# Patient Record
Sex: Female | Born: 1975 | Hispanic: Refuse to answer | Marital: Married | Smoking: Never smoker
Health system: Southern US, Community
[De-identification: ages and names within clinical notes are randomized; demographics above are authoritative.]

---

## 1999-02-19 ENCOUNTER — Other Ambulatory Visit: Admission: RE | Admit: 1999-02-19 | Discharge: 1999-02-19 | Payer: Self-pay | Admitting: Obstetrics and Gynecology

## 2000-06-27 ENCOUNTER — Other Ambulatory Visit: Admission: RE | Admit: 2000-06-27 | Discharge: 2000-06-27 | Payer: Self-pay | Admitting: Obstetrics and Gynecology

## 2001-08-21 ENCOUNTER — Other Ambulatory Visit: Admission: RE | Admit: 2001-08-21 | Discharge: 2001-08-21 | Payer: Self-pay | Admitting: Obstetrics and Gynecology

## 2002-05-23 ENCOUNTER — Other Ambulatory Visit: Admission: RE | Admit: 2002-05-23 | Discharge: 2002-05-23 | Payer: Self-pay | Admitting: Obstetrics and Gynecology

## 2014-02-21 ENCOUNTER — Other Ambulatory Visit (HOSPITAL_COMMUNITY)
Admission: RE | Admit: 2014-02-21 | Discharge: 2014-02-21 | Disposition: A | Discharge status: Home / Self Care | Payer: BC Managed Care – PPO | Source: Ambulatory Visit | Attending: Obstetrics and Gynecology | Admitting: Obstetrics and Gynecology

## 2014-02-21 ENCOUNTER — Other Ambulatory Visit: Payer: Self-pay | Admitting: Obstetrics and Gynecology

## 2014-02-21 DIAGNOSIS — Z1151 Encounter for screening for human papillomavirus (HPV): Secondary | ICD-10-CM | POA: Insufficient documentation

## 2014-02-21 DIAGNOSIS — Z01419 Encounter for gynecological examination (general) (routine) without abnormal findings: Secondary | ICD-10-CM | POA: Insufficient documentation

## 2014-02-24 LAB — CYTOLOGY - PAP

## 2014-06-12 ENCOUNTER — Ambulatory Visit
Admission: RE | Admit: 2014-06-12 | Discharge: 2014-06-12 | Disposition: A | Discharge status: Home / Self Care | Payer: BLUE CROSS/BLUE SHIELD | Source: Ambulatory Visit | Attending: Internal Medicine | Admitting: Internal Medicine

## 2014-06-12 ENCOUNTER — Other Ambulatory Visit: Payer: Self-pay | Admitting: Internal Medicine

## 2014-06-12 DIAGNOSIS — M79671 Pain in right foot: Secondary | ICD-10-CM

## 2018-06-19 ENCOUNTER — Other Ambulatory Visit (HOSPITAL_COMMUNITY)
Admission: RE | Admit: 2018-06-19 | Discharge: 2018-06-19 | Disposition: A | Discharge status: Home / Self Care | Payer: BLUE CROSS/BLUE SHIELD | Source: Ambulatory Visit | Attending: Obstetrics and Gynecology | Admitting: Obstetrics and Gynecology

## 2018-06-19 ENCOUNTER — Other Ambulatory Visit: Payer: Self-pay | Admitting: Obstetrics and Gynecology

## 2018-06-19 DIAGNOSIS — Z124 Encounter for screening for malignant neoplasm of cervix: Secondary | ICD-10-CM | POA: Insufficient documentation

## 2018-06-19 DIAGNOSIS — Z01411 Encounter for gynecological examination (general) (routine) with abnormal findings: Secondary | ICD-10-CM | POA: Insufficient documentation

## 2018-06-22 LAB — CYTOLOGY - PAP
Diagnosis: NEGATIVE
HPV (WINDOPATH): NOT DETECTED

## 2019-07-04 ENCOUNTER — Other Ambulatory Visit: Payer: Self-pay | Admitting: Internal Medicine

## 2019-07-04 ENCOUNTER — Ambulatory Visit
Admission: RE | Admit: 2019-07-04 | Discharge: 2019-07-04 | Disposition: A | Discharge status: Home / Self Care | Payer: No Typology Code available for payment source | Source: Ambulatory Visit | Attending: Internal Medicine | Admitting: Internal Medicine

## 2019-07-04 DIAGNOSIS — R1033 Periumbilical pain: Secondary | ICD-10-CM

## 2020-12-06 IMAGING — CR DG ABDOMEN 2V
2 series · 2 of 2 positions shown · non-contrast
Comparison: None.

CLINICAL DATA: Periumbilical abdominal pain for 4-5 days.

EXAM:
ABDOMEN - 2 VIEW

[t abdomen supine]
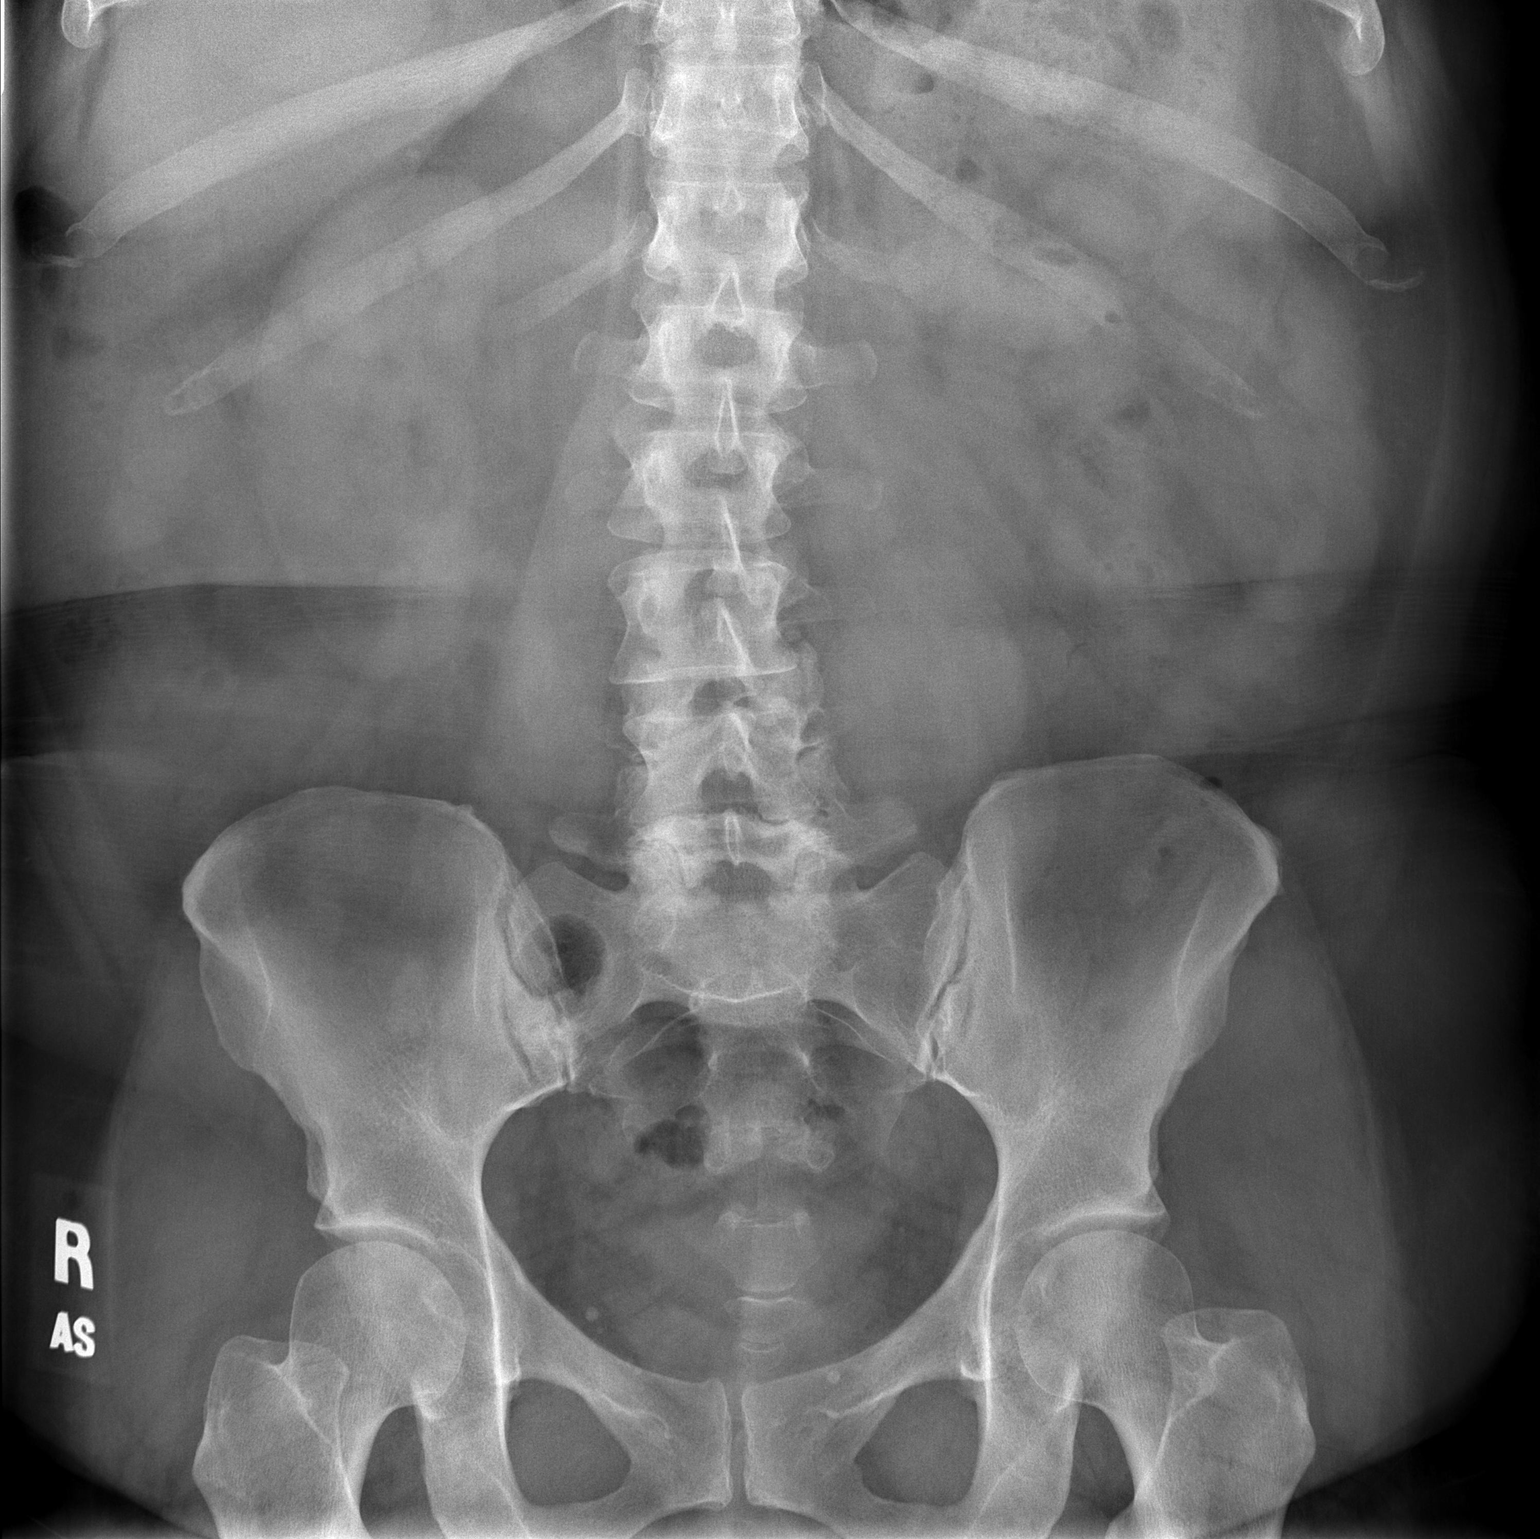

[w abdomen upright]
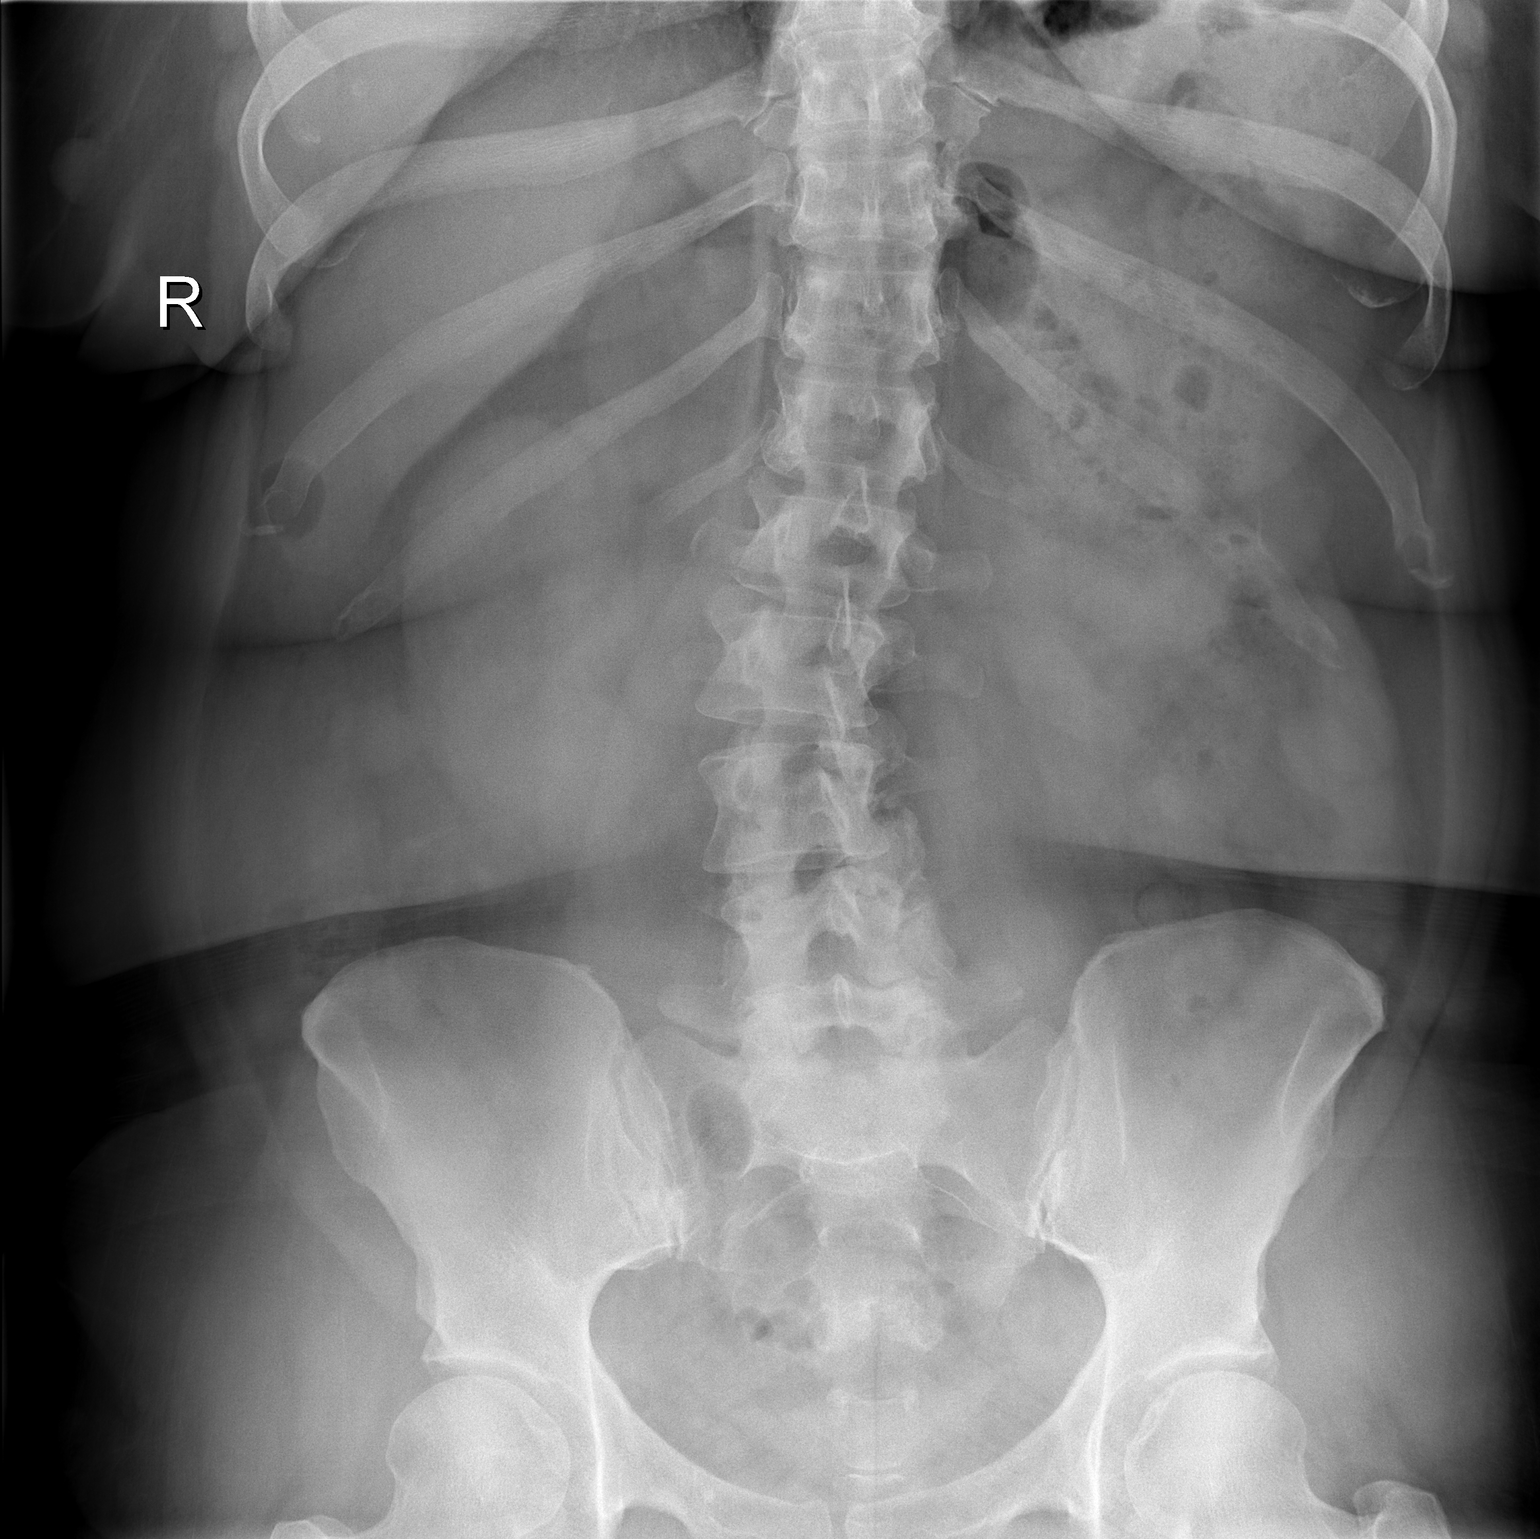

[2 of 2 positions shown; findings below may reference images not displayed]

FINDINGS: The bowel gas pattern is normal. There is no evidence of free air.
No radio-opaque calculi or other significant radiographic
abnormality is seen.
IMPRESSION: Negative exam.

## 2022-05-02 ENCOUNTER — Emergency Department (HOSPITAL_BASED_OUTPATIENT_CLINIC_OR_DEPARTMENT_OTHER): Payer: No Typology Code available for payment source | Admitting: Radiology

## 2022-05-02 ENCOUNTER — Emergency Department (HOSPITAL_BASED_OUTPATIENT_CLINIC_OR_DEPARTMENT_OTHER)
Admission: EM | Admit: 2022-05-02 | Discharge: 2022-05-02 | Disposition: A | Discharge status: Home / Self Care | Payer: No Typology Code available for payment source | Attending: Emergency Medicine | Admitting: Emergency Medicine

## 2022-05-02 ENCOUNTER — Other Ambulatory Visit: Payer: Self-pay

## 2022-05-02 DIAGNOSIS — I10 Essential (primary) hypertension: Secondary | ICD-10-CM | POA: Diagnosis not present

## 2022-05-02 DIAGNOSIS — E119 Type 2 diabetes mellitus without complications: Secondary | ICD-10-CM | POA: Insufficient documentation

## 2022-05-02 DIAGNOSIS — R0789 Other chest pain: Secondary | ICD-10-CM | POA: Diagnosis not present

## 2022-05-02 LAB — CBC
HCT: 41 % (ref 36.0–46.0)
Hemoglobin: 13.7 g/dL (ref 12.0–15.0)
MCH: 30.6 pg (ref 26.0–34.0)
MCHC: 33.4 g/dL (ref 30.0–36.0)
MCV: 91.5 fL (ref 80.0–100.0)
Platelets: 404 10*3/uL — ABNORMAL HIGH (ref 150–400)
RBC: 4.48 MIL/uL (ref 3.87–5.11)
RDW: 13 % (ref 11.5–15.5)
WBC: 11.3 10*3/uL — ABNORMAL HIGH (ref 4.0–10.5)
nRBC: 0 % (ref 0.0–0.2)

## 2022-05-02 LAB — BASIC METABOLIC PANEL
Anion gap: 9 (ref 5–15)
BUN: 13 mg/dL (ref 6–20)
CO2: 27 mmol/L (ref 22–32)
Calcium: 10.2 mg/dL (ref 8.9–10.3)
Chloride: 101 mmol/L (ref 98–111)
Creatinine, Ser: 0.75 mg/dL (ref 0.44–1.00)
GFR, Estimated: >60 mL/min (ref >60)
Glucose, Bld: 116 mg/dL — ABNORMAL HIGH (ref 70–99)
Potassium: 3.7 mmol/L (ref 3.5–5.1)
Sodium: 137 mmol/L (ref 135–145)

## 2022-05-02 LAB — TROPONIN I (HIGH SENSITIVITY)
Troponin I (High Sensitivity): 3 ng/L (ref <18)
Troponin I (High Sensitivity): 3 ng/L (ref <18)

## 2022-05-02 NOTE — ED Provider Notes (Signed)
[facility] Provider Note   CSN: [id number] Arrival date & time: 05/02/22  0131     History  Chief Complaint  Patient presents with   Chest Pain    Tamara Lopez is a 47 y.o. female.  HPI     This is a 47 year old female with a history of diabetes and hypertension who presents with chest pain.  Patient reports onset of chest pain around 8 PM last night.  She states she was sitting in her chair.  It is pressure-like and radiates up into her neck.  No recent illnesses, fevers, shortness of breath.  Pain has been fairly constant.  It is not exertional in nature.  Nothing seems to make it better or worse.  She has never had pain like this before.  Home Medications Prior to Admission medications   Not on File      Allergies    Patient has no allergy information on record.    Review of Systems   Review of Systems  Constitutional:  Negative for fever.  Respiratory:  Negative for shortness of breath.   Cardiovascular:  Positive for chest pain. Negative for leg swelling.    Physical Exam Updated Vital Signs BP (!) 162/104   Pulse 93   Temp 98.7 F (37.1 C) (Oral)   Resp 16   Ht 1.657 m (5' 5.25")   Wt 95.7 kg   SpO2 100%   BMI 34.84 kg/m  Physical Exam Vitals and nursing note reviewed.  Constitutional:      Appearance: She is well-developed. She is obese. She is not ill-appearing.  HENT:     Head: Normocephalic and atraumatic.  Eyes:     Pupils: Pupils are equal, round, and reactive to light.  Cardiovascular:     Rate and Rhythm: Normal rate and regular rhythm.     Heart sounds: Normal heart sounds.  Pulmonary:     Effort: Pulmonary effort is normal. No respiratory distress.     Breath sounds: No wheezing.  Abdominal:     General: Bowel sounds are normal.     Palpations: Abdomen is soft.  Musculoskeletal:     Cervical back: Neck supple.  Skin:    General: Skin is warm and dry.  Neurological:     Mental Status:  She is alert and oriented to person, place, and time.  Psychiatric:        Mood and Affect: Mood normal.     ED Results / Procedures / Treatments   Labs (all labs ordered are listed, but only abnormal results are displayed) Labs Reviewed  BASIC METABOLIC PANEL - Abnormal; Notable for the following components:      Result Value   Glucose, Bld 116 (*)    All other components within normal limits  CBC - Abnormal; Notable for the following components:   WBC 11.3 (*)    Platelets 404 (*)    All other components within normal limits  TROPONIN I (HIGH SENSITIVITY)  TROPONIN I (HIGH SENSITIVITY)    EKG EKG Interpretation  Date/Time:  Monday May 02 2022 01:48:45 EST Ventricular Rate:  92 PR Interval:  162 QRS Duration: 91 QT Interval:  360 QTC Calculation: 446 R Axis:   0 Text Interpretation: Sinus rhythm Low voltage, precordial leads Confirmed by Thayer Jew [phone]) on 05/02/2022 1:57:00 AM  Radiology DG Chest 2 View  Result Date: 05/02/2022 CLINICAL DATA:  Chest pain EXAM: CHEST - 2 VIEW COMPARISON:  None Available. FINDINGS: Lungs  are clear.  No pleural effusion or pneumothorax. The heart is normal in size. Visualized osseous structures are within normal limits. IMPRESSION: Normal chest radiographs. Electronically Signed   By: Julian Hy M.D.   On: 05/02/2022 02:14    Procedures Procedures    Medications Ordered in ED Medications - No data to display  ED Course/ Medical Decision Making/ A&P                             Medical Decision Making Amount and/or Complexity of Data Reviewed Labs: ordered. Radiology: ordered.   This patient presents to the ED for concern of Chest pain, this involves an extensive number of treatment options, and is a complaint that carries with it a high risk of complications and morbidity.  I considered the following differential and admission for this acute, potentially life threatening condition.  The differential diagnosis  includes ACS, pneumothorax, pneumonia, gastritis, PE     MDM:    This is a 47 year old female who presents with chest discomfort.  She is nontoxic-appearing.  Vital signs notable for blood pressure 162/104.  Reports history of hypertension.  Pain is somewhat atypical.  Nonexertional.  Constant in nature.  Considerations include but not limited to reflux, ACS, pneumothorax, pneumonia.  EKG shows no evidence of acute ischemia or arrhythmia.  Chest x-ray without pneumothorax or pneumonia.  Troponin x 2 negative.  Overall heart score is 3 for risk factors and age.  Patient is overall low risk for PE and is PERC negative.  Could be reflux.  Discussed with patient presumptive treatment with a PPI.  In the meantime will refer to cardiology for functional testing as an outpatient.  Patient reassured.  She is agreeable to plan.  (Labs, imaging, consults)  Labs: I Ordered, and personally interpreted labs.  The pertinent results include: CBC, BMP, troponin x 2  Imaging Studies ordered: I ordered imaging studies including chest x-ray I independently visualized and interpreted imaging. I agree with the radiologist interpretation  Additional history obtained from family at bedside.  External records from outside source obtained and reviewed including prior evaluations  Cardiac Monitoring: The patient was maintained on a cardiac monitor.  I personally viewed and interpreted the cardiac monitored which showed an underlying rhythm of: Sinus rhythm  Reevaluation: After the interventions noted above, I reevaluated the patient and found that they have :improved  Social Determinants of Health:  lives independently  Disposition: Discharged with cardiology follow-up  Co morbidities that complicate the patient evaluation No past medical history on file.   Medicines No orders of the defined types were placed in this encounter.   I have reviewed the patients home medicines and have made adjustments as  needed  Problem List / ED Course: Problem List Items Addressed This Visit   None Visit Diagnoses     Atypical chest pain    -  Primary   Relevant Orders   Ambulatory referral to Cardiology                   Final Clinical Impression(s) / ED Diagnoses Final diagnoses:  Atypical chest pain    Rx / DC Orders ED Discharge Orders          Ordered    Ambulatory referral to Cardiology        05/02/22 0410              Merryl Hacker, MD 05/02/22 [phone]

## 2022-05-02 NOTE — ED Triage Notes (Addendum)
Arrives POV from home. CP starting at 2000- radiating to neck- feels like pressure. Denies SOB, -N/-V.  HX diabetes and HTN.

## 2022-05-02 NOTE — Discharge Instructions (Signed)
You were seen today for chest pain.  Your workup is overall reassuring.  However, given your risk factors, you should be evaluated by cardiology for functional testing and possible stress testing.  If you have new or worsening symptoms, you should be reevaluated.

## 2022-05-15 NOTE — Progress Notes (Unsigned)
Cardiology Office Note:    Date:  05/16/2022   ID:  Tamara Lopez, DOB 07-Jun-1975, MRN [id number]  PCP:  Leeroy Cha, MD  Cardiologist:  None  Electrophysiologist:  None   Referring MD: Leeroy Cha,*   Chief Complaint  Patient presents with   Chest Pain    History of Present Illness:    Tamara Lopez is a 47 y.o. female with a hx of diabetes, hypertension who presents as an ED follow-up for chest pain.  She was seen in ED on 05/02/2022 with chest pain.  Workup including troponins x 2 was negative.  She reports that evening she had onset of tightness in chest, radiating to her neck.  She went to bed and woke up and was still having chest pain, so went to ED.  Had another flare 1 week later.  Feels like has improved with Pepcid.  She goes for walks intermittently, denies any exertional chest pain or dyspnea.  Denies any lightheadedness, syncope, lower extremity edema.  Reports rare palpitations, few times per year and lasts for couple minutes.  Feels like heart is fluttering.  She has lost 45 pounds over the last 5 years.  No smoking history.  No family history of heart disease.   No past medical history on file.   Current Medications: Current Meds  Medication Sig   atorvastatin (LIPITOR) 10 MG tablet Take 10 mg by mouth every other day.   lisinopril (ZESTRIL) 2.5 MG tablet Take 2.5 mg by mouth daily.   metFORMIN (GLUCOPHAGE-XR) 500 MG 24 hr tablet Take 500 mg by mouth every evening.   metoprolol tartrate (LOPRESSOR) 100 MG tablet Take 2 hours prior to CT scan   MOUNJARO 7.5 MG/0.5ML Pen Inject 7.5 mg into the skin once a week.     Allergies:   Sulfamethoxazole   Social History   Socioeconomic History   Marital status: Unknown    Spouse name: Not on file   Number of children: Not on file   Years of education: Not on file   Highest education level: Not on file  Occupational History   Not on file  Tobacco Use   Smoking status: Not on file   Smokeless  tobacco: Not on file  Substance and Sexual Activity   Alcohol use: Not on file   Drug use: Not on file   Sexual activity: Not on file  Other Topics Concern   Not on file  Social History Narrative   Not on file   Social Determinants of Health   Financial Resource Strain: Not on file  Food Insecurity: Not on file  Transportation Needs: Not on file  Physical Activity: Not on file  Stress: Not on file  Social Connections: Not on file     Family History: The patient's family history is not on file.  ROS:   Please see the history of present illness.     All other systems reviewed and are negative.  EKGs/Labs/Other Studies Reviewed:    The following studies were reviewed today:   EKG:   05/16/22: Normal sinus rhythm, rate 85, low voltage  Recent Labs: 05/02/2022: BUN 13; Creatinine, Ser 0.75; Hemoglobin 13.7; Platelets 404; Potassium 3.7; Sodium 137  Recent Lipid Panel No results found for: "CHOL", "TRIG", "HDL", "CHOLHDL", "VLDL", "LDLCALC", "LDLDIRECT"  Physical Exam:    VS:  BP 116/72 (BP Location: Left Arm, Patient Position: Sitting, Cuff Size: Normal)   Pulse 85   Ht 5' 5"$  (1.651 m)   Wt 214 lb (97.1  kg)   SpO2 98%   BMI 35.61 kg/m     Wt Readings from Last 3 Encounters:  05/16/22 214 lb (97.1 kg)  05/02/22 211 lb (95.7 kg)     GEN:  Well nourished, well developed in no acute distress HEENT: Normal NECK: No JVD; No carotid bruits LYMPHATICS: No lymphadenopathy CARDIAC: RRR, no murmurs, rubs, gallops RESPIRATORY:  Clear to auscultation without rales, wheezing or rhonchi  ABDOMEN: Soft, non-tender, non-distended MUSCULOSKELETAL:  No edema; No deformity  SKIN: Warm and dry NEUROLOGIC:  Alert and oriented x 3 PSYCHIATRIC:  Normal affect   ASSESSMENT:    1. Precordial pain   2. Palpitations   3. Essential hypertension   4. Hyperlipidemia, unspecified hyperlipidemia type    PLAN:    Chest pain: Atypical in description.  Does have CAD risk factors  (hypertension, T2DM, hyperlipidemia).  Recommend coronary CTA to rule out obstructive CAD.  Will give Lopressor 100 mg prior to study  Palpitations: Description concerning for possible arrhythmia but occurs rarely, reports occurs a few times per year.  Will monitor, if having increased frequency or duration plan cardiac monitor for further evaluation  Hypertension: On lisinopril 2.5 mg daily.  Appears controlled  T2DM: On metformin and Mounjaro.  A1c 6.1% on 03/16/2022  Hyperlipidemia: On atorvastatin 10 mg every other day.  LDL 74 on 09/21/2021.  Follow-up results of coronary CTA to guide how aggressive to be in lowering cholesterol  RTC in 6 months  Medication Adjustments/Labs and Tests Ordered: Current medicines are reviewed at length with the patient today.  Concerns regarding medicines are outlined above.  Orders Placed This Encounter  Procedures   CT CORONARY MORPH W/CTA COR W/SCORE W/CA W/CM &/OR WO/CM   Basic Metabolic Panel (BMET)   EKG 12-Lead   Meds ordered this encounter  Medications   metoprolol tartrate (LOPRESSOR) 100 MG tablet    Sig: Take 2 hours prior to CT scan    Dispense:  1 tablet    Refill:  0    Patient Instructions   Lab Work:  Your physician recommends that you return for lab work in: [place]  If you have labs (blood work) drawn today and your tests are completely normal, you will receive your results only by: [employer] (if you have MyChart) OR A paper copy in the mail If you have any lab test that is abnormal or we need to change your treatment, we will call you to review the results.   Testing/Procedures:    Your cardiac CT will be scheduled at   [facility] [place], [place] [address] [phone]   If scheduled at [facility], please arrive at the [facility] and Children's Entrance (Entrance C2) of [facility] 30 minutes prior to test start time. You can use the FREE  valet parking offered at entrance C (encouraged to control the heart rate for the test)  Proceed to the [facility] Radiology Department (first floor) to check-in and test prep.  All radiology patients and guests should use entrance C2 at [facility], accessed from [facility], even though the hospital's physical address listed is [address]      Please follow these instructions carefully (unless otherwise directed):    On the Night Before the Test: Be sure to Drink plenty of water. Do not consume any caffeinated/decaffeinated beverages or chocolate 12 hours prior to your test. Do not take any antihistamines  12 hours prior to your test.   On the Day of the Test: Drink plenty of water until 1 hour prior to the test. Do not eat any food 1 hour prior to test. You may take your regular medications prior to the test.  Take metoprolol (Lopressor) 100 MG two hours prior to test. FEMALES- please wear underwire-free bra if available, avoid dresses & tight clothing        After the Test: Drink plenty of water. After receiving IV contrast, you may experience a mild flushed feeling. This is normal. On occasion, you may experience a mild rash up to 24 hours after the test. This is not dangerous. If this occurs, you can take Benadryl 25 mg and increase your fluid intake. If you experience trouble breathing, this can be serious. If it is severe call 911 IMMEDIATELY. If it is mild, please call our office. DO NOT TAKE METFORMIN THE MORNING OF THE PROCEDURE AND 48 HOURS AFTER THE PROCEDURE-RESTART THE 3RD DAY AFTER PROCEDURE  We will call to schedule your test 2-4 weeks out understanding that some insurance companies will need an authorization prior to the service being performed.   For non-scheduling related questions, please contact the cardiac imaging nurse navigator should you have any questions/concerns: Marchia Bond, Cardiac Imaging Nurse Navigator Gordy Clement, Cardiac Imaging Nurse Navigator [facility] Direct Office Dial: [phone]   For scheduling needs, including cancellations and rescheduling, please call [place], [phone].    Follow-Up: At [facility], you and your health needs are our priority.  As part of our continuing mission to provide you with exceptional heart care, we have created designated Provider Care Teams.  These Care Teams include your primary Cardiologist (physician) and Advanced Practice Providers (APPs -  Physician Assistants and Nurse Practitioners) who all work together to provide you with the care you need, when you need it.  We recommend signing up for the patient portal called "MyChart".  Sign up information is provided on this After Visit Summary.  MyChart is used to connect with patients for Virtual Visits (Telemedicine).  Patients are able to view lab/test results, encounter notes, upcoming appointments, etc.  Non-urgent messages can be sent to your provider as well.   To learn more about what you can do with MyChart, go to [email or web address].    Your next appointment:   6 month(s)  Provider:   Oswaldo Milian MD      Signed, Donato Heinz, MD  05/16/2022 9:39 AM    [place]

## 2022-05-16 ENCOUNTER — Ambulatory Visit: Payer: No Typology Code available for payment source | Attending: Cardiology | Admitting: Cardiology

## 2022-05-16 VITALS — BP 116/72 | HR 85 | Ht 65.0 in | Wt 214.0 lb

## 2022-05-16 DIAGNOSIS — R002 Palpitations: Secondary | ICD-10-CM | POA: Diagnosis not present

## 2022-05-16 DIAGNOSIS — E785 Hyperlipidemia, unspecified: Secondary | ICD-10-CM

## 2022-05-16 DIAGNOSIS — I10 Essential (primary) hypertension: Secondary | ICD-10-CM

## 2022-05-16 DIAGNOSIS — R072 Precordial pain: Secondary | ICD-10-CM

## 2022-05-16 LAB — BASIC METABOLIC PANEL
BUN/Creatinine Ratio: 15 (ref 9–23)
BUN: 13 mg/dL (ref 6–24)
CO2: 23 mmol/L (ref 20–29)
Calcium: 9.6 mg/dL (ref 8.7–10.2)
Chloride: 102 mmol/L (ref 96–106)
Creatinine, Ser: 0.85 mg/dL (ref 0.57–1.00)
Glucose: 91 mg/dL (ref 70–99)
Potassium: 5 mmol/L (ref 3.5–5.2)
Sodium: 138 mmol/L (ref 134–144)
eGFR: 84 mL/min/{1.73_m2} (ref >59)

## 2022-05-16 MED ORDER — METOPROLOL TARTRATE 100 MG PO TABS: ORAL_TABLET | ORAL | 0 refills | Status: DC

## 2022-05-16 NOTE — Patient Instructions (Signed)
Lab Work:  Your physician recommends that you return for lab work in: [place]  If you have labs (blood work) drawn today and your tests are completely normal, you will receive your results only by: [employer] (if you have [facility]) OR A paper copy in the mail If you have any lab test that is abnormal or we need to change your treatment, we will call you to review the results.   Testing/Procedures:    Your cardiac CT will be scheduled at   [facility] [place], [place] [address] [phone]   If scheduled at [facility], please arrive at the [facility] and Children's Entrance (Entrance C2) of [facility] 30 minutes prior to test start time. You can use the FREE valet parking offered at entrance C (encouraged to control the heart rate for the test)  Proceed to the [facility] Radiology Department (first floor) to check-in and test prep.  All radiology patients and guests should use entrance C2 at [facility], accessed from [facility], even though the hospital's physical address listed is [address]      Please follow these instructions carefully (unless otherwise directed):    On the Night Before the Test: Be sure to Drink plenty of water. Do not consume any caffeinated/decaffeinated beverages or chocolate 12 hours prior to your test. Do not take any antihistamines 12 hours prior to your test.   On the Day of the Test: Drink plenty of water until 1 hour prior to the test. Do not eat any food 1 hour prior to test. You may take your regular medications prior to the test.  Take metoprolol (Lopressor) 100 MG two hours prior to test. FEMALES- please wear underwire-free bra if available, avoid dresses & tight clothing        After the Test: Drink plenty of water. After receiving IV contrast, you may experience a mild flushed feeling. This is normal. On occasion, you may  experience a mild rash up to 24 hours after the test. This is not dangerous. If this occurs, you can take Benadryl 25 mg and increase your fluid intake. If you experience trouble breathing, this can be serious. If it is severe call 911 IMMEDIATELY. If it is mild, please call our office. DO NOT TAKE METFORMIN THE MORNING OF THE PROCEDURE AND 48 HOURS AFTER THE PROCEDURE-RESTART THE 3RD DAY AFTER PROCEDURE  We will call to schedule your test 2-4 weeks out understanding that some insurance companies will need an authorization prior to the service being performed.   For non-scheduling related questions, please contact the cardiac imaging nurse navigator should you have any questions/concerns: Marchia Bond, Cardiac Imaging Nurse Navigator Gordy Clement, Cardiac Imaging Nurse Navigator [facility] Direct Office Dial: [phone]   For scheduling needs, including cancellations and rescheduling, please call [place], [phone].    Follow-Up: At [facility], you and your health needs are our priority.  As part of our continuing mission to provide you with exceptional heart care, we have created designated Provider Care Teams.  These Care Teams include your primary Cardiologist (physician) and Advanced Practice Providers (APPs -  Physician Assistants and Nurse Practitioners) who all work together to provide you with the care you need, when you need it.  We recommend signing up for the patient portal called "MyChart".  Sign up information is provided on this After Visit Summary.  MyChart is  used to connect with patients for Virtual Visits (Telemedicine).  Patients are able to view lab/test results, encounter notes, upcoming appointments, etc.  Non-urgent messages can be sent to your provider as well.   To learn more about what you can do with MyChart, go to [email or web address].    Your next appointment:   6 month(s)  Provider:   Oswaldo Milian MD

## 2022-05-17 ENCOUNTER — Encounter: Payer: Self-pay | Admitting: *Deleted

## 2022-06-01 ENCOUNTER — Telehealth (HOSPITAL_COMMUNITY): Payer: Self-pay | Admitting: *Deleted

## 2022-06-01 NOTE — Telephone Encounter (Signed)
Reaching out to patient to offer assistance regarding upcoming cardiac imaging study; pt verbalizes understanding of appt date/time, parking situation and where to check in, pre-test NPO status and medications ordered, and verified current allergies; name and call back number provided for further questions should they arise  Gordy Clement RN Navigator Cardiac Imaging [facility] [phone] office [phone] cell  Patient to hold her lisinopril and take '100mg'$  metoprolol tartrate two hours prior to her cardiac CT scan. She is aware to arrive at 8am.

## 2022-06-02 ENCOUNTER — Other Ambulatory Visit (HOSPITAL_COMMUNITY): Payer: Self-pay | Admitting: *Deleted

## 2022-06-02 ENCOUNTER — Ambulatory Visit (HOSPITAL_COMMUNITY)
Admission: RE | Admit: 2022-06-02 | Discharge: 2022-06-02 | Disposition: A | Discharge status: Home / Self Care | Payer: No Typology Code available for payment source | Source: Ambulatory Visit | Attending: Cardiology | Admitting: Cardiology

## 2022-06-02 ENCOUNTER — Encounter (HOSPITAL_COMMUNITY): Payer: Self-pay

## 2022-06-02 ENCOUNTER — Other Ambulatory Visit (HOSPITAL_COMMUNITY): Payer: Self-pay

## 2022-06-02 DIAGNOSIS — R072 Precordial pain: Secondary | ICD-10-CM | POA: Insufficient documentation

## 2022-06-02 MED ORDER — METOPROLOL TARTRATE 5 MG/5ML IV SOLN: 10.0000 mg | Freq: Once | INTRAVENOUS | Status: AC

## 2022-06-02 MED ORDER — IVABRADINE HCL 5 MG PO TABS
ORAL_TABLET | ORAL | 0 refills | Status: AC
  Filled 2022-06-02: qty 3, 2d supply, fill #0

## 2022-06-02 MED ORDER — DILTIAZEM HCL 25 MG/5ML IV SOLN
INTRAVENOUS | Status: AC
  Filled 2022-06-02: qty 5

## 2022-06-02 MED ORDER — DILTIAZEM HCL 25 MG/5ML IV SOLN
10.0000 mg | Freq: Once | INTRAVENOUS | Status: AC
  Administered 2022-06-02: 10 mg via INTRAVENOUS

## 2022-06-02 MED ORDER — METOPROLOL TARTRATE 100 MG PO TABS
ORAL_TABLET | ORAL | 0 refills | Status: AC
  Filled 2022-06-02: qty 1, 1d supply, fill #0

## 2022-06-02 MED ORDER — METOPROLOL TARTRATE 5 MG/5ML IV SOLN
10.0000 mg | Freq: Once | INTRAVENOUS | Status: AC
  Administered 2022-06-02: 10 mg via INTRAVENOUS

## 2022-06-02 MED ORDER — METOPROLOL TARTRATE 5 MG/5ML IV SOLN
INTRAVENOUS | Status: AC
  Administered 2022-06-02: 10 mg via INTRAVENOUS
  Filled 2022-06-02: qty 20

## 2022-06-02 MED ORDER — NITROGLYCERIN 0.4 MG SL SUBL: 0.8000 mg | SUBLINGUAL_TABLET | Freq: Once | SUBLINGUAL | Status: DC

## 2022-06-06 ENCOUNTER — Other Ambulatory Visit (HOSPITAL_COMMUNITY): Payer: Self-pay

## 2022-06-08 ENCOUNTER — Telehealth (HOSPITAL_COMMUNITY): Payer: Self-pay | Admitting: Emergency Medicine

## 2022-06-08 NOTE — Telephone Encounter (Signed)
Reaching out to patient to offer assistance regarding upcoming cardiac imaging study; pt verbalizes understanding of appt date/time, parking situation and where to check in, pre-test NPO status and medications ordered, and verified current allergies; name and call back number provided for further questions should they arise Tamara Bond RN Navigator Cardiac Imaging [facility] [phone] office [phone] cell   Arrival 730 WC entrance '100mg'$  metoprolol + '15mg'$  ivabradine Denies iv issues Aware contrast.nitro

## 2022-06-09 ENCOUNTER — Ambulatory Visit (HOSPITAL_COMMUNITY)
Admission: RE | Admit: 2022-06-09 | Discharge: 2022-06-09 | Disposition: A | Discharge status: Home / Self Care | Payer: No Typology Code available for payment source | Source: Ambulatory Visit | Attending: Cardiology | Admitting: Cardiology

## 2022-06-09 DIAGNOSIS — R072 Precordial pain: Secondary | ICD-10-CM | POA: Insufficient documentation

## 2022-06-09 MED ORDER — METOPROLOL TARTRATE 5 MG/5ML IV SOLN
INTRAVENOUS | Status: AC
  Filled 2022-06-09: qty 10

## 2022-06-09 MED ORDER — NITROGLYCERIN 0.4 MG SL SUBL
SUBLINGUAL_TABLET | SUBLINGUAL | Status: AC
  Administered 2022-06-09: 0.8 mg via SUBLINGUAL
  Filled 2022-06-09: qty 2

## 2022-06-09 MED ORDER — METOPROLOL TARTRATE 5 MG/5ML IV SOLN: 5.0000 mg | INTRAVENOUS | Status: DC | PRN

## 2022-06-09 MED ORDER — IOHEXOL 350 MG/ML SOLN
100.0000 mL | Freq: Once | INTRAVENOUS | Status: AC | PRN
  Administered 2022-06-09: 100 mL via INTRAVENOUS

## 2022-06-09 MED ORDER — NITROGLYCERIN 0.4 MG SL SUBL: 0.8000 mg | SUBLINGUAL_TABLET | Freq: Once | SUBLINGUAL | Status: AC

## 2022-06-17 ENCOUNTER — Other Ambulatory Visit: Payer: Self-pay

## 2022-06-17 DIAGNOSIS — I288 Other diseases of pulmonary vessels: Secondary | ICD-10-CM

## 2022-06-17 DIAGNOSIS — E785 Hyperlipidemia, unspecified: Secondary | ICD-10-CM

## 2022-06-17 MED ORDER — ATORVASTATIN CALCIUM 10 MG PO TABS: 10.0000 mg | ORAL_TABLET | Freq: Every day | ORAL | 3 refills | Status: DC

## 2022-07-14 ENCOUNTER — Ambulatory Visit (HOSPITAL_COMMUNITY): Payer: No Typology Code available for payment source | Attending: Internal Medicine

## 2022-07-14 DIAGNOSIS — I288 Other diseases of pulmonary vessels: Secondary | ICD-10-CM | POA: Diagnosis present

## 2022-07-14 LAB — ECHOCARDIOGRAM COMPLETE
Area-P 1/2: 1.97 cm2
S' Lateral: 2.9 cm

## 2022-08-03 ENCOUNTER — Telehealth: Payer: Self-pay | Admitting: Cardiology

## 2022-08-03 NOTE — Telephone Encounter (Signed)
Little Ishikawa, MD     No significant abnormalities   Spoke with patient regarding the following results. Patient made aware and patient verbalized understanding.   Advised patient to call back to office with any issues, questions, or concerns. Patient verbalized understanding.

## 2022-08-03 NOTE — Telephone Encounter (Signed)
Patient is returning call to discuss echo results. °

## 2022-11-24 ENCOUNTER — Ambulatory Visit: Payer: No Typology Code available for payment source | Attending: Cardiology | Admitting: Cardiology

## 2022-11-24 ENCOUNTER — Ambulatory Visit (INDEPENDENT_AMBULATORY_CARE_PROVIDER_SITE_OTHER): Payer: No Typology Code available for payment source

## 2022-11-24 VITALS — BP 128/84 | HR 70 | Ht 65.25 in | Wt 209.6 lb

## 2022-11-24 DIAGNOSIS — I2510 Atherosclerotic heart disease of native coronary artery without angina pectoris: Secondary | ICD-10-CM | POA: Diagnosis not present

## 2022-11-24 DIAGNOSIS — R0683 Snoring: Secondary | ICD-10-CM

## 2022-11-24 DIAGNOSIS — R002 Palpitations: Secondary | ICD-10-CM

## 2022-11-24 DIAGNOSIS — I10 Essential (primary) hypertension: Secondary | ICD-10-CM

## 2022-11-24 DIAGNOSIS — E785 Hyperlipidemia, unspecified: Secondary | ICD-10-CM | POA: Diagnosis not present

## 2022-11-24 NOTE — Progress Notes (Signed)
Cardiology Office Note:    Date:  11/24/2022   ID:  Tamara Lopez, DOB Jan 14, 1976, MRN [id number]  PCP:  Lorenda Ishihara, MD  Cardiologist:  Little Ishikawa, MD  Electrophysiologist:  None   Referring MD: Lorenda Ishihara,*   Chief Complaint  Patient presents with   Coronary Artery Disease    History of Present Illness:    Tamara Lopez is a 47 y.o. female with a hx of diabetes, hypertension who presents for follow-up.  She was seen in ED on 05/02/2022 with chest pain.  Workup including troponins x 2 was negative.  She reports that evening she had onset of tightness in chest, radiating to her neck.  She went to bed and woke up and was still having chest pain, so went to ED.  Had another flare 1 week later.  Feels like has improved with Pepcid.  She goes for walks intermittently, denies any exertional chest pain or dyspnea.  Denies any lightheadedness, syncope, lower extremity edema.  Reports rare palpitations, few times per year and lasts for couple minutes.  Feels like heart is fluttering.  She has lost 45 pounds over the last 5 years.  No smoking history.  No family history of heart disease.  Coronary CTA 06/09/2022 showed nonobstructive CAD with minimal (0-24%) stenosis in ostial OM1, calcium score 74 (98th percentile), dilated main PA measuring 31mm.  Echocardiogram 07/14/22 showed normal biventricular function, no significant valve disease.  Since last clinic visit, she reports she is doing well.  Denies any chest pain, dyspnea, lightheadedness, syncope, lower extremity edema.  Does report she has been having palpitations where feels like heart is fluttering, lasts for couple minutes and resolves.  Has lost nearly 50 pounds in last 5 years.  No past medical history on file.   Current Medications: Current Meds  Medication Sig   atorvastatin (LIPITOR) 10 MG tablet Take 1 tablet (10 mg total) by mouth daily.   lisinopril (ZESTRIL) 2.5 MG tablet Take 5 mg by mouth  daily.   metFORMIN (GLUCOPHAGE-XR) 500 MG 24 hr tablet Take 500 mg by mouth every evening.   MOUNJARO 7.5 MG/0.5ML Pen Inject 7.5 mg into the skin once a week.     Allergies:   Sulfamethoxazole   Social History   Socioeconomic History   Marital status: Married    Spouse name: Not on file   Number of children: Not on file   Years of education: Not on file   Highest education level: Not on file  Occupational History   Not on file  Tobacco Use   Smoking status: Not on file   Smokeless tobacco: Not on file  Substance and Sexual Activity   Alcohol use: Not on file   Drug use: Not on file   Sexual activity: Not on file  Other Topics Concern   Not on file  Social History Narrative   Not on file   Social Determinants of Health   Financial Resource Strain: Not on file  Food Insecurity: Not on file  Transportation Needs: Not on file  Physical Activity: Not on file  Stress: Not on file  Social Connections: Not on file     Family History: The patient's family history is not on file.  ROS:   Please see the history of present illness.     All other systems reviewed and are negative.  EKGs/Labs/Other Studies Reviewed:    The following studies were reviewed today:   EKG:   05/16/22: Normal sinus rhythm, rate 85,  low voltage 11/24/22: NSR, rate 88, no ST abnormalities  Recent Labs: 05/02/2022: Hemoglobin 13.7; Platelets 404 05/16/2022: BUN 13; Creatinine, Ser 0.85; Potassium 5.0; Sodium 138  Recent Lipid Panel No results found for: "CHOL", "TRIG", "HDL", "CHOLHDL", "VLDL", "LDLCALC", "LDLDIRECT"  Physical Exam:    VS:  BP 128/84 (BP Location: Left Arm, Patient Position: Sitting, Cuff Size: Normal)   Pulse 70   Ht 5' 5.25" (1.657 m)   Wt 209 lb 9.6 oz (95.1 kg)   SpO2 97%   BMI 34.61 kg/m     Wt Readings from Last 3 Encounters:  11/24/22 209 lb 9.6 oz (95.1 kg)  05/16/22 214 lb (97.1 kg)  05/02/22 211 lb (95.7 kg)     GEN:  Well nourished, well developed in no  acute distress HEENT: Normal NECK: No JVD; No carotid bruits LYMPHATICS: No lymphadenopathy CARDIAC: RRR, no murmurs, rubs, gallops RESPIRATORY:  Clear to auscultation without rales, wheezing or rhonchi  ABDOMEN: Soft, non-tender, non-distended MUSCULOSKELETAL:  No edema; No deformity  SKIN: Warm and dry NEUROLOGIC:  Alert and oriented x 3 PSYCHIATRIC:  Normal affect   ASSESSMENT:    1. Coronary artery disease involving native coronary artery of native heart without angina pectoris   2. Essential hypertension   3. Palpitations   4. Hyperlipidemia, unspecified hyperlipidemia type   5. Snoring     PLAN:    CAD: Reported atypical chest pain.  Coronary CTA 06/09/2022 showed nonobstructive CAD with minimal (0-24%) stenosis in ostial OM1, calcium score 74 (98th percentile), dilated main PA measuring 31mm.  Echocardiogram 07/14/22 showed normal biventricular function, no significant valve disease. -Continue atorvastatin.  Check lipid panel.    Palpitations: Description concerning for arrhythmia, will evaluate with Zio patch x 2 weeks  Hypertension: On lisinopril 5 mg daily.  Appears controlled  T2DM: On metformin and Mounjaro.  A1c 6.1% on 03/16/2022  Hyperlipidemia: On atorvastatin 10 mg every other day.  LDL 74 on 09/21/2021.  Given coronary CTA results, recommended increase atorvastatin to 10 mg daily.  Will check lipid panel  Snoring: Discussed screening for OSA, states that she will think about it and follow-up with PCP  RTC in 6 months  Medication Adjustments/Labs and Tests Ordered: Current medicines are reviewed at length with the patient today.  Concerns regarding medicines are outlined above.  Orders Placed This Encounter  Procedures   Lipid panel   LONG TERM MONITOR (3-14 DAYS)   EKG 12-Lead   No orders of the defined types were placed in this encounter.   Patient Instructions  Medication Instructions:  Your physician recommends that you continue on your current  medications as directed. Please refer to the Current Medication list given to you today.  *If you need a refill on your cardiac medications before your next appointment, please call your pharmacy*   Lab Work: Lipid panel  If you have labs (blood work) drawn today and your tests are completely normal, you will receive your results only by: MyChart Message (if you have MyChart) OR A paper copy in the mail If you have any lab test that is abnormal or we need to change your treatment, we will call you to review the results.   Testing/Procedures: Christena Deem- Long Term Monitor Instructions  Your physician has requested you wear a ZIO patch monitor for 14 days.  This is a single patch monitor. [employer] supplies one patch monitor per enrollment. Additional stickers are not available. Please do not apply patch if you will be having a  Nuclear Stress Test,  Echocardiogram, Cardiac CT, MRI, or Chest Xray during the period you would be wearing the  monitor. The patch cannot be worn during these tests. You cannot remove and re-apply the  ZIO XT patch monitor.  Your ZIO patch monitor will be mailed 3 day USPS to your address on file. It may take 3-5 days  to receive your monitor after you have been enrolled.  Once you have received your monitor, please review the enclosed instructions. Your monitor  has already been registered assigning a specific monitor serial # to you.  Billing and Patient Assistance Program Information  We have supplied [employer] with any of your insurance information on file for billing purposes. [employer] offers a sliding scale Patient Assistance Program for patients that do not have  insurance, or whose insurance does not completely cover the cost of the ZIO monitor.  You must apply for the Patient Assistance Program to qualify for this discounted rate.  To apply, please call [employer] at [phone], select option 4, select option 2, ask to apply for  Patient Assistance Program.  Meredeth Ide will ask your household income, and how many people  are in your household. They will quote your out-of-pocket cost based on that information.  [employer] will also be able to set up a 51-month, interest-free payment plan if needed.  Applying the monitor   Shave hair from upper left chest.  Hold abrader disc by orange tab. Rub abrader in 40 strokes over the upper left chest as  indicated in your monitor instructions.  Clean area with 4 enclosed alcohol pads. Let dry.  Apply patch as indicated in monitor instructions. Patch will be placed under collarbone on left  side of chest with arrow pointing upward.  Rub patch adhesive wings for 2 minutes. Remove white label marked "1". Remove the white  label marked "2". Rub patch adhesive wings for 2 additional minutes.  While looking in a mirror, press and release button in center of patch. A small green light will  flash 3-4 times. This will be your only indicator that the monitor has been turned on.  Do not shower for the first 24 hours. You may shower after the first 24 hours.  Press the button if you feel a symptom. You will hear a small click. Record Date, Time and  Symptom in the Patient Logbook.  When you are ready to remove the patch, follow instructions on the last 2 pages of Patient  Logbook. Stick patch monitor onto the last page of Patient Logbook.  Place Patient Logbook in the blue and white box. Use locking tab on box and tape box closed  securely. The blue and white box has prepaid postage on it. Please place it in the mailbox as  soon as possible. Your physician should have your test results approximately 7 days after the  monitor has been mailed back to [facility].  Call [facility] Customer Care at [phone] if you have questions regarding  your ZIO XT patch monitor. Call them immediately if you see an orange light blinking on your  monitor.  If your monitor falls off in less than 4 days, contact our Monitor  department at [phone].  If your monitor becomes loose or falls off after 4 days call [employer] at [phone] for  suggestions on securing your monitor    Follow-Up: At [facility], you and your health needs are our priority.  As part of our continuing mission to provide you with exceptional heart  care, we have created designated Provider Care Teams.  These Care Teams include your primary Cardiologist (physician) and Advanced Practice Providers (APPs -  Physician Assistants and Nurse Practitioners) who all work together to provide you with the care you need, when you need it.  Your next appointment:   12 month(s)  Provider:   Little Ishikawa, MD    Signed, Little Ishikawa, MD  11/24/2022 5:32 PM    [facility]

## 2022-11-24 NOTE — Progress Notes (Unsigned)
Enrolled for [employer] to mail a ZIO XT long term holter monitor to the patients address on file.  

## 2022-11-24 NOTE — Patient Instructions (Signed)
Medication Instructions:  Your physician recommends that you continue on your current medications as directed. Please refer to the Current Medication list given to you today.  *If you need a refill on your cardiac medications before your next appointment, please call your pharmacy*   Lab Work: Lipid panel  If you have labs (blood work) drawn today and your tests are completely normal, you will receive your results only by: MyChart Message (if you have MyChart) OR A paper copy in the mail If you have any lab test that is abnormal or we need to change your treatment, we will call you to review the results.   Testing/Procedures: Christena Deem- Long Term Monitor Instructions  Your physician has requested you wear a ZIO patch monitor for 14 days.  This is a single patch monitor. [employer] supplies one patch monitor per enrollment. Additional stickers are not available. Please do not apply patch if you will be having a Nuclear Stress Test,  Echocardiogram, Cardiac CT, MRI, or Chest Xray during the period you would be wearing the  monitor. The patch cannot be worn during these tests. You cannot remove and re-apply the  ZIO XT patch monitor.  Your ZIO patch monitor will be mailed 3 day USPS to your address on file. It may take 3-5 days  to receive your monitor after you have been enrolled.  Once you have received your monitor, please review the enclosed instructions. Your monitor  has already been registered assigning a specific monitor serial # to you.  Billing and Patient Assistance Program Information  We have supplied [employer] with any of your insurance information on file for billing purposes. [employer] offers a sliding scale Patient Assistance Program for patients that do not have  insurance, or whose insurance does not completely cover the cost of the ZIO monitor.  You must apply for the Patient Assistance Program to qualify for this discounted rate.  To apply, please call [employer] at  [phone], select option 4, select option 2, ask to apply for  Patient Assistance Program. Meredeth Ide will ask your household income, and how many people  are in your household. They will quote your out-of-pocket cost based on that information.  [employer] will also be able to set up a 48-month, interest-free payment plan if needed.  Applying the monitor   Shave hair from upper left chest.  Hold abrader disc by orange tab. Rub abrader in 40 strokes over the upper left chest as  indicated in your monitor instructions.  Clean area with 4 enclosed alcohol pads. Let dry.  Apply patch as indicated in monitor instructions. Patch will be placed under collarbone on left  side of chest with arrow pointing upward.  Rub patch adhesive wings for 2 minutes. Remove white label marked "1". Remove the white  label marked "2". Rub patch adhesive wings for 2 additional minutes.  While looking in a mirror, press and release button in center of patch. A small green light will  flash 3-4 times. This will be your only indicator that the monitor has been turned on.  Do not shower for the first 24 hours. You may shower after the first 24 hours.  Press the button if you feel a symptom. You will hear a small click. Record Date, Time and  Symptom in the Patient Logbook.  When you are ready to remove the patch, follow instructions on the last 2 pages of Patient  Logbook. Stick patch monitor onto the last page of Patient Logbook.  Place Patient  Logbook in the blue and white box. Use locking tab on box and tape box closed  securely. The blue and white box has prepaid postage on it. Please place it in the mailbox as  soon as possible. Your physician should have your test results approximately 7 days after the  monitor has been mailed back to [facility].  Call [facility] Customer Care at [phone] if you have questions regarding  your ZIO XT patch monitor. Call them immediately if you see an orange light  blinking on your  monitor.  If your monitor falls off in less than 4 days, contact our Monitor department at [phone].  If your monitor becomes loose or falls off after 4 days call [employer] at [phone] for  suggestions on securing your monitor    Follow-Up: At [facility], you and your health needs are our priority.  As part of our continuing mission to provide you with exceptional heart care, we have created designated Provider Care Teams.  These Care Teams include your primary Cardiologist (physician) and Advanced Practice Providers (APPs -  Physician Assistants and Nurse Practitioners) who all work together to provide you with the care you need, when you need it.  Your next appointment:   12 month(s)  Provider:   Little Ishikawa, MD

## 2022-11-25 LAB — LIPID PANEL
Chol/HDL Ratio: 2.4 ratio (ref 0.0–4.4)
Cholesterol, Total: 145 mg/dL (ref 100–199)
HDL: 60 mg/dL (ref >39)
LDL Chol Calc (NIH): 66 mg/dL (ref 0–99)
Triglycerides: 108 mg/dL (ref 0–149)
VLDL Cholesterol Cal: 19 mg/dL (ref 5–40)

## 2022-12-02 DIAGNOSIS — R002 Palpitations: Secondary | ICD-10-CM

## 2023-07-31 ENCOUNTER — Other Ambulatory Visit: Payer: Self-pay | Admitting: Cardiology

## 2023-12-03 NOTE — Progress Notes (Unsigned)
 Cardiology Office Note:    Date:  12/05/2023   ID:  Tamara Lopez, DOB 04-20-1975, MRN [id number]  PCP:  Elliot Charm, MD  Cardiologist:  Lonni LITTIE Nanas, MD  Electrophysiologist:  None   Referring MD: Elliot Charm,*   Chief Complaint  Patient presents with   Coronary Artery Disease    History of Present Illness:    Tamara Lopez is a 48 y.o. female with a hx of diabetes, hypertension who presents for follow-up.  She was seen in ED on 05/02/2022 with chest pain.  Workup including troponins x 2 was negative.  She reports that evening she had onset of tightness in chest, radiating to her neck.  She went to bed and woke up and was still having chest pain, so went to ED.  Had another flare 1 week later.  Feels like has improved with Pepcid.  She goes for walks intermittently, denies any exertional chest pain or dyspnea.  Denies any lightheadedness, syncope, lower extremity edema.  Reports rare palpitations, few times per year and lasts for couple minutes.  Feels like heart is fluttering.  She has lost 45 pounds over the last 5 years.  No smoking history.  No family history of heart disease.  Coronary CTA 06/09/2022 showed nonobstructive CAD with minimal (0-24%) stenosis in ostial OM1, calcium  score 74 (98th percentile), dilated main PA measuring 31mm.  Echocardiogram 07/14/22 showed normal biventricular function, no significant valve disease.  Zio patch x 14 days 11/2022 showed 1 episode of NSVT lasting 6 beats and 10 episodes of SVT with longest lasting 10 beats.  Since last clinic visit, she reports she is doing well.  Denies any chest pain, dyspnea, lightheadedness, syncope, lower extremity edema, or palpitations.   No past medical history on file.   Current Medications: Current Meds  Medication Sig   atorvastatin  (LIPITOR) 20 MG tablet Take 1 tablet (20 mg total) by mouth daily.   Cholecalciferol 10 MCG (400 UNIT) CHEW Chew 1 tablet by mouth daily.   lisinopril  (ZESTRIL) 2.5 MG tablet Take 5 mg by mouth daily.   metFORMIN (GLUCOPHAGE-XR) 500 MG 24 hr tablet Take 500 mg by mouth every evening.   MOUNJARO 10 MG/0.5ML Pen Inject 10 mg into the skin once a week.   [DISCONTINUED] atorvastatin  (LIPITOR) 10 MG tablet TAKE 1 TABLET BY MOUTH EVERY DAY     Allergies:   Sulfamethoxazole   Social History   Socioeconomic History   Marital status: Married    Spouse name: Not on file   Number of children: Not on file   Years of education: Not on file   Highest education level: Not on file  Occupational History   Not on file  Tobacco Use   Smoking status: Never   Smokeless tobacco: Never  Substance and Sexual Activity   Alcohol use: Not on file   Drug use: Not on file   Sexual activity: Not on file  Other Topics Concern   Not on file  Social History Narrative   Not on file   Social Drivers of Health   Financial Resource Strain: Not on file  Food Insecurity: Not on file  Transportation Needs: Not on file  Physical Activity: Not on file  Stress: Not on file  Social Connections: Not on file     Family History: The patient's family history is not on file.  ROS:   Please see the history of present illness.     All other systems reviewed and are negative.  EKGs/Labs/Other Studies Reviewed:    The following studies were reviewed today:   EKG:   05/16/22: Normal sinus rhythm, rate 85, low voltage 11/24/22: NSR, rate 88, no ST abnormalities 9-25: Normal sinus rhythm, rate 93, poor R wave progression  Recent Labs: No results found for requested labs within last 365 days.  Recent Lipid Panel    Component Value Date/Time   CHOL 145 11/24/2022 1410   TRIG 108 11/24/2022 1410   HDL 60 11/24/2022 1410   CHOLHDL 2.4 11/24/2022 1410   LDLCALC 66 11/24/2022 1410    Physical Exam:    VS:  BP 124/82 (BP Location: Right Arm, Patient Position: Sitting, Cuff Size: Large)   Pulse 93   Ht 5' 5 (1.651 m)   Wt 211 lb (95.7 kg)   SpO2 94%    BMI 35.11 kg/m     Wt Readings from Last 3 Encounters:  12/05/23 211 lb (95.7 kg)  11/24/22 209 lb 9.6 oz (95.1 kg)  05/16/22 214 lb (97.1 kg)     GEN:  Well nourished, well developed in no acute distress HEENT: Normal NECK: No JVD; No carotid bruits LYMPHATICS: No lymphadenopathy CARDIAC: RRR, no murmurs, rubs, gallops RESPIRATORY:  Clear to auscultation without rales, wheezing or rhonchi  ABDOMEN: Soft, non-tender, non-distended MUSCULOSKELETAL:  No edema; No deformity  SKIN: Warm and dry NEUROLOGIC:  Alert and oriented x 3 PSYCHIATRIC:  Normal affect   ASSESSMENT:    1. Coronary artery disease involving native coronary artery of native heart without angina pectoris   2. Hyperlipidemia, unspecified hyperlipidemia type   3. Essential hypertension   4. Palpitations   5. Snoring      PLAN:    CAD: Reported atypical chest pain.  Coronary CTA 06/09/2022 showed nonobstructive CAD with minimal (0-24%) stenosis in ostial OM1, calcium  score 74 (98th percentile), dilated main PA measuring 31mm.  Echocardiogram 07/14/22 showed normal biventricular function, no significant valve disease. -Continue atorvastatin . Will increase dose to 20 mg daily  Palpitations: Zio patch x 14 days 11/2022 showed 1 episode of NSVT lasting 6 beats and 10 episodes of SVT with longest lasting 10 beats.  Hypertension: On lisinopril 5 mg daily.  Appears controlled  T2DM: On metformin and Mounjaro.  A1c 6.3% on 08/2023  Hyperlipidemia: On atorvastatin  10 mg daily.  LDL 75 on 08/08/2023.  Will increase atorvastatin  to 20 mg daily, goal LDL less than 70  Snoring: Discussed screening for OSA, states that she will think about it   RTC in 1 year  Medication Adjustments/Labs and Tests Ordered: Current medicines are reviewed at length with the patient today.  Concerns regarding medicines are outlined above.  Orders Placed This Encounter  Procedures   EKG 12-Lead   Meds ordered this encounter  Medications    atorvastatin  (LIPITOR) 20 MG tablet    Sig: Take 1 tablet (20 mg total) by mouth daily.    Dispense:  90 tablet    Refill:  3    Patient Instructions  Medication Instructions:  Increase Atorvastatin  20mg  daily *If you need a refill on your cardiac medications before your next appointment, please call your pharmacy*  Lab Work: none If you have labs (blood work) drawn today and your tests are completely normal, you will receive your results only by: MyChart Message (if you have MyChart) OR A paper copy in the mail If you have any lab test that is abnormal or we need to change your treatment, we will call you to review the results.  Testing/Procedures: none  Follow-Up: At [facility], you and your health needs are our priority.  As part of our continuing mission to provide you with exceptional heart care, our providers are all part of one team.  This team includes your primary Cardiologist (physician) and Advanced Practice Providers or APPs (Physician Assistants and Nurse Practitioners) who all work together to provide you with the care you need, when you need it.  Your next appointment:   1 YEAR  Provider:   Dr. Kate  We recommend signing up for the patient portal called MyChart.  Sign up information is provided on this After Visit Summary.  MyChart is used to connect with patients for Virtual Visits (Telemedicine).  Patients are able to view lab/test results, encounter notes, upcoming appointments, etc.  Non-urgent messages can be sent to your provider as well.   To learn more about what you can do with MyChart, go to [email or web address].   Other Instructions none       Signed, Lonni LITTIE Kate, MD  12/05/2023 3:41 PM    [facility]

## 2023-12-05 ENCOUNTER — Ambulatory Visit: Attending: Cardiology | Admitting: Cardiology

## 2023-12-05 ENCOUNTER — Encounter: Payer: Self-pay | Admitting: Cardiology

## 2023-12-05 VITALS — BP 124/82 | HR 93 | Ht 65.0 in | Wt 211.0 lb

## 2023-12-05 DIAGNOSIS — I10 Essential (primary) hypertension: Secondary | ICD-10-CM | POA: Diagnosis not present

## 2023-12-05 DIAGNOSIS — R002 Palpitations: Secondary | ICD-10-CM | POA: Diagnosis not present

## 2023-12-05 DIAGNOSIS — E785 Hyperlipidemia, unspecified: Secondary | ICD-10-CM

## 2023-12-05 DIAGNOSIS — I2510 Atherosclerotic heart disease of native coronary artery without angina pectoris: Secondary | ICD-10-CM | POA: Diagnosis not present

## 2023-12-05 DIAGNOSIS — R0683 Snoring: Secondary | ICD-10-CM

## 2023-12-05 MED ORDER — ATORVASTATIN CALCIUM 20 MG PO TABS: 20.0000 mg | ORAL_TABLET | Freq: Every day | ORAL | 3 refills | Status: AC

## 2023-12-05 NOTE — Patient Instructions (Signed)
 Medication Instructions:  Increase Atorvastatin  20mg  daily *If you need a refill on your cardiac medications before your next appointment, please call your pharmacy*  Lab Work: none If you have labs (blood work) drawn today and your tests are completely normal, you will receive your results only by: MyChart Message (if you have MyChart) OR A paper copy in the mail If you have any lab test that is abnormal or we need to change your treatment, we will call you to review the results.  Testing/Procedures: none  Follow-Up: At [facility], you and your health needs are our priority.  As part of our continuing mission to provide you with exceptional heart care, our providers are all part of one team.  This team includes your primary Cardiologist (physician) and Advanced Practice Providers or APPs (Physician Assistants and Nurse Practitioners) who all work together to provide you with the care you need, when you need it.  Your next appointment:   1 YEAR  Provider:   Dr. Kate  We recommend signing up for the patient portal called MyChart.  Sign up information is provided on this After Visit Summary.  MyChart is used to connect with patients for Virtual Visits (Telemedicine).  Patients are able to view lab/test results, encounter notes, upcoming appointments, etc.  Non-urgent messages can be sent to your provider as well.   To learn more about what you can do with MyChart, go to [email or web address].   Other Instructions none
